# Patient Record
Sex: Female | Born: 1980 | Race: Black or African American | Hispanic: No | Marital: Single | Smoking: Never smoker
Health system: Southern US, Community
[De-identification: ages and names within clinical notes are randomized; demographics above are authoritative.]

## PROBLEM LIST (undated history)

## (undated) DIAGNOSIS — E079 Disorder of thyroid, unspecified: Secondary | ICD-10-CM

---

## 1999-01-02 ENCOUNTER — Emergency Department (HOSPITAL_COMMUNITY): Admission: EM | Admit: 1999-01-02 | Discharge: 1999-01-02 | Payer: Self-pay | Admitting: Emergency Medicine

## 1999-09-25 ENCOUNTER — Emergency Department (HOSPITAL_COMMUNITY): Admission: EM | Admit: 1999-09-25 | Discharge: 1999-09-25 | Payer: Self-pay

## 2000-11-26 ENCOUNTER — Encounter: Payer: Self-pay | Admitting: Obstetrics

## 2000-11-26 ENCOUNTER — Inpatient Hospital Stay (HOSPITAL_COMMUNITY): Admission: AD | Admit: 2000-11-26 | Discharge: 2000-11-26 | Payer: Self-pay | Admitting: Obstetrics

## 2000-12-09 ENCOUNTER — Encounter: Admission: RE | Admit: 2000-12-09 | Discharge: 2000-12-09 | Payer: Self-pay | Admitting: Family Medicine

## 2001-04-09 ENCOUNTER — Emergency Department (HOSPITAL_COMMUNITY): Admission: EM | Admit: 2001-04-09 | Discharge: 2001-04-10 | Payer: Self-pay | Admitting: Emergency Medicine

## 2001-04-27 ENCOUNTER — Inpatient Hospital Stay (HOSPITAL_COMMUNITY): Admission: AD | Admit: 2001-04-27 | Discharge: 2001-04-27 | Payer: Self-pay | Admitting: *Deleted

## 2001-09-15 ENCOUNTER — Encounter: Admission: RE | Admit: 2001-09-15 | Discharge: 2001-09-15 | Payer: Self-pay | Admitting: Family Medicine

## 2001-09-28 ENCOUNTER — Emergency Department (HOSPITAL_COMMUNITY): Admission: EM | Admit: 2001-09-28 | Discharge: 2001-09-28 | Payer: Self-pay | Admitting: Emergency Medicine

## 2001-11-03 ENCOUNTER — Encounter: Admission: RE | Admit: 2001-11-03 | Discharge: 2001-11-03 | Payer: Self-pay | Admitting: Family Medicine

## 2001-12-09 ENCOUNTER — Encounter: Admission: RE | Admit: 2001-12-09 | Discharge: 2001-12-09 | Payer: Self-pay | Admitting: Family Medicine

## 2002-06-11 ENCOUNTER — Encounter: Admission: RE | Admit: 2002-06-11 | Discharge: 2002-06-11 | Payer: Self-pay | Admitting: Family Medicine

## 2002-08-31 ENCOUNTER — Encounter: Admission: RE | Admit: 2002-08-31 | Discharge: 2002-08-31 | Payer: Self-pay | Admitting: Sports Medicine

## 2002-09-10 ENCOUNTER — Other Ambulatory Visit: Admission: RE | Admit: 2002-09-10 | Discharge: 2002-09-10 | Payer: Self-pay | Admitting: Family Medicine

## 2003-01-18 ENCOUNTER — Encounter: Payer: Self-pay | Admitting: Emergency Medicine

## 2003-01-18 ENCOUNTER — Emergency Department (HOSPITAL_COMMUNITY): Admission: EM | Admit: 2003-01-18 | Discharge: 2003-01-19 | Payer: Self-pay | Admitting: Emergency Medicine

## 2003-02-22 ENCOUNTER — Encounter: Admission: RE | Admit: 2003-02-22 | Discharge: 2003-02-22 | Payer: Self-pay | Admitting: Family Medicine

## 2003-04-21 ENCOUNTER — Encounter: Admission: RE | Admit: 2003-04-21 | Discharge: 2003-04-21 | Payer: Self-pay | Admitting: Sports Medicine

## 2003-07-18 ENCOUNTER — Encounter: Admission: RE | Admit: 2003-07-18 | Discharge: 2003-07-18 | Payer: Self-pay | Admitting: Family Medicine

## 2003-10-18 ENCOUNTER — Encounter: Admission: RE | Admit: 2003-10-18 | Discharge: 2003-10-18 | Payer: Self-pay | Admitting: Family Medicine

## 2003-11-04 ENCOUNTER — Encounter: Admission: RE | Admit: 2003-11-04 | Discharge: 2003-11-04 | Payer: Self-pay | Admitting: Sports Medicine

## 2003-11-04 ENCOUNTER — Other Ambulatory Visit: Admission: RE | Admit: 2003-11-04 | Discharge: 2003-11-04 | Payer: Self-pay | Admitting: Family Medicine

## 2004-01-05 ENCOUNTER — Encounter: Admission: RE | Admit: 2004-01-05 | Discharge: 2004-01-05 | Payer: Self-pay | Admitting: Sports Medicine

## 2004-01-12 ENCOUNTER — Encounter: Admission: RE | Admit: 2004-01-12 | Discharge: 2004-01-12 | Payer: Self-pay | Admitting: Family Medicine

## 2004-02-06 ENCOUNTER — Emergency Department (HOSPITAL_COMMUNITY): Admission: EM | Admit: 2004-02-06 | Discharge: 2004-02-07 | Payer: Self-pay | Admitting: Emergency Medicine

## 2004-03-20 ENCOUNTER — Encounter: Admission: RE | Admit: 2004-03-20 | Discharge: 2004-03-20 | Payer: Self-pay | Admitting: Sports Medicine

## 2004-05-29 ENCOUNTER — Encounter: Admission: RE | Admit: 2004-05-29 | Discharge: 2004-05-29 | Payer: Self-pay | Admitting: Family Medicine

## 2004-08-29 ENCOUNTER — Ambulatory Visit: Payer: Self-pay | Admitting: Family Medicine

## 2004-09-20 ENCOUNTER — Ambulatory Visit: Payer: Self-pay | Admitting: Family Medicine

## 2004-11-06 ENCOUNTER — Ambulatory Visit: Payer: Self-pay | Admitting: Family Medicine

## 2004-11-09 ENCOUNTER — Ambulatory Visit: Payer: Self-pay | Admitting: Sports Medicine

## 2004-11-18 ENCOUNTER — Encounter (INDEPENDENT_AMBULATORY_CARE_PROVIDER_SITE_OTHER): Payer: Self-pay | Admitting: *Deleted

## 2004-11-18 LAB — CONVERTED CEMR LAB

## 2004-11-26 ENCOUNTER — Ambulatory Visit: Payer: Self-pay | Admitting: Family Medicine

## 2004-11-28 ENCOUNTER — Ambulatory Visit: Payer: Self-pay | Admitting: Family Medicine

## 2004-12-27 ENCOUNTER — Ambulatory Visit: Payer: Self-pay | Admitting: Family Medicine

## 2005-04-24 ENCOUNTER — Ambulatory Visit: Payer: Self-pay | Admitting: Family Medicine

## 2005-05-13 ENCOUNTER — Ambulatory Visit: Payer: Self-pay | Admitting: Sports Medicine

## 2005-07-30 ENCOUNTER — Ambulatory Visit: Payer: Self-pay | Admitting: Family Medicine

## 2005-12-04 ENCOUNTER — Emergency Department (HOSPITAL_COMMUNITY): Admission: EM | Admit: 2005-12-04 | Discharge: 2005-12-04 | Payer: Self-pay | Admitting: Emergency Medicine

## 2005-12-12 ENCOUNTER — Ambulatory Visit: Payer: Self-pay | Admitting: Sports Medicine

## 2006-01-01 ENCOUNTER — Ambulatory Visit: Payer: Self-pay | Admitting: Family Medicine

## 2006-04-25 ENCOUNTER — Ambulatory Visit: Payer: Self-pay | Admitting: Family Medicine

## 2006-05-07 ENCOUNTER — Encounter (INDEPENDENT_AMBULATORY_CARE_PROVIDER_SITE_OTHER): Payer: Self-pay | Admitting: *Deleted

## 2006-05-07 ENCOUNTER — Ambulatory Visit: Payer: Self-pay | Admitting: Family Medicine

## 2006-05-13 ENCOUNTER — Encounter (HOSPITAL_COMMUNITY): Admission: RE | Admit: 2006-05-13 | Discharge: 2006-07-31 | Payer: Self-pay | Admitting: Family Medicine

## 2006-06-02 ENCOUNTER — Ambulatory Visit: Payer: Self-pay | Admitting: Family Medicine

## 2006-06-23 ENCOUNTER — Ambulatory Visit: Payer: Self-pay | Admitting: Sports Medicine

## 2006-07-12 ENCOUNTER — Emergency Department (HOSPITAL_COMMUNITY): Admission: EM | Admit: 2006-07-12 | Discharge: 2006-07-12 | Payer: Self-pay | Admitting: Family Medicine

## 2006-08-26 ENCOUNTER — Ambulatory Visit: Payer: Self-pay | Admitting: Family Medicine

## 2006-09-10 ENCOUNTER — Ambulatory Visit: Payer: Self-pay | Admitting: Sports Medicine

## 2006-09-11 ENCOUNTER — Ambulatory Visit (HOSPITAL_COMMUNITY): Admission: RE | Admit: 2006-09-11 | Discharge: 2006-09-11 | Payer: Self-pay | Admitting: Family Medicine

## 2006-12-29 ENCOUNTER — Emergency Department (HOSPITAL_COMMUNITY): Admission: EM | Admit: 2006-12-29 | Discharge: 2006-12-29 | Payer: Self-pay | Admitting: Family Medicine

## 2007-01-15 DIAGNOSIS — F329 Major depressive disorder, single episode, unspecified: Secondary | ICD-10-CM

## 2007-01-15 DIAGNOSIS — G43909 Migraine, unspecified, not intractable, without status migrainosus: Secondary | ICD-10-CM | POA: Insufficient documentation

## 2007-01-15 DIAGNOSIS — J309 Allergic rhinitis, unspecified: Secondary | ICD-10-CM | POA: Insufficient documentation

## 2007-01-15 DIAGNOSIS — M545 Low back pain: Secondary | ICD-10-CM

## 2007-01-15 DIAGNOSIS — E0590 Thyrotoxicosis, unspecified without thyrotoxic crisis or storm: Secondary | ICD-10-CM | POA: Insufficient documentation

## 2007-01-15 DIAGNOSIS — E0500 Thyrotoxicosis with diffuse goiter without thyrotoxic crisis or storm: Secondary | ICD-10-CM | POA: Insufficient documentation

## 2007-01-16 ENCOUNTER — Encounter (INDEPENDENT_AMBULATORY_CARE_PROVIDER_SITE_OTHER): Payer: Self-pay | Admitting: *Deleted

## 2007-03-23 ENCOUNTER — Emergency Department (HOSPITAL_COMMUNITY): Admission: EM | Admit: 2007-03-23 | Discharge: 2007-03-23 | Payer: Self-pay | Admitting: Emergency Medicine

## 2007-11-16 ENCOUNTER — Emergency Department (HOSPITAL_COMMUNITY): Admission: EM | Admit: 2007-11-16 | Discharge: 2007-11-16 | Payer: Self-pay | Admitting: Emergency Medicine

## 2007-12-29 ENCOUNTER — Ambulatory Visit: Payer: Self-pay | Admitting: Family Medicine

## 2007-12-29 ENCOUNTER — Encounter (INDEPENDENT_AMBULATORY_CARE_PROVIDER_SITE_OTHER): Payer: Self-pay | Admitting: *Deleted

## 2007-12-29 LAB — CONVERTED CEMR LAB: Rapid Strep: NEGATIVE

## 2008-02-21 ENCOUNTER — Emergency Department (HOSPITAL_COMMUNITY): Admission: EM | Admit: 2008-02-21 | Discharge: 2008-02-21 | Payer: Self-pay | Admitting: Family Medicine

## 2008-03-30 ENCOUNTER — Emergency Department (HOSPITAL_COMMUNITY): Admission: EM | Admit: 2008-03-30 | Discharge: 2008-03-30 | Payer: Self-pay | Admitting: Family Medicine

## 2008-08-10 ENCOUNTER — Emergency Department (HOSPITAL_COMMUNITY): Admission: EM | Admit: 2008-08-10 | Discharge: 2008-08-10 | Payer: Self-pay | Admitting: Emergency Medicine

## 2008-09-08 ENCOUNTER — Ambulatory Visit: Payer: Self-pay | Admitting: Endocrinology

## 2008-09-08 ENCOUNTER — Encounter (INDEPENDENT_AMBULATORY_CARE_PROVIDER_SITE_OTHER): Payer: Self-pay | Admitting: *Deleted

## 2008-09-08 LAB — CONVERTED CEMR LAB
Free T4: 1 ng/dL (ref 0.6–1.6)
TSH: 0.69 microintl units/mL (ref 0.35–5.50)

## 2008-10-04 ENCOUNTER — Telehealth: Payer: Self-pay | Admitting: *Deleted

## 2008-11-28 ENCOUNTER — Telehealth: Payer: Self-pay | Admitting: *Deleted

## 2008-12-26 ENCOUNTER — Emergency Department (HOSPITAL_COMMUNITY): Admission: EM | Admit: 2008-12-26 | Discharge: 2008-12-26 | Payer: Self-pay | Admitting: Family Medicine

## 2009-01-13 ENCOUNTER — Ambulatory Visit: Payer: Self-pay | Admitting: Family Medicine

## 2009-01-13 ENCOUNTER — Encounter (INDEPENDENT_AMBULATORY_CARE_PROVIDER_SITE_OTHER): Payer: Self-pay | Admitting: Family Medicine

## 2009-01-25 ENCOUNTER — Telehealth (INDEPENDENT_AMBULATORY_CARE_PROVIDER_SITE_OTHER): Payer: Self-pay | Admitting: *Deleted

## 2009-02-09 ENCOUNTER — Encounter: Payer: Self-pay | Admitting: Family Medicine

## 2009-02-09 ENCOUNTER — Ambulatory Visit: Payer: Self-pay | Admitting: Family Medicine

## 2009-02-13 ENCOUNTER — Telehealth (INDEPENDENT_AMBULATORY_CARE_PROVIDER_SITE_OTHER): Payer: Self-pay | Admitting: Family Medicine

## 2009-02-13 ENCOUNTER — Ambulatory Visit: Payer: Self-pay | Admitting: Family Medicine

## 2009-02-13 ENCOUNTER — Encounter: Payer: Self-pay | Admitting: Family Medicine

## 2009-02-13 DIAGNOSIS — J111 Influenza due to unidentified influenza virus with other respiratory manifestations: Secondary | ICD-10-CM | POA: Insufficient documentation

## 2009-07-25 ENCOUNTER — Emergency Department (HOSPITAL_BASED_OUTPATIENT_CLINIC_OR_DEPARTMENT_OTHER): Admission: EM | Admit: 2009-07-25 | Discharge: 2009-07-25 | Payer: Self-pay | Admitting: Emergency Medicine

## 2009-11-06 ENCOUNTER — Emergency Department (HOSPITAL_BASED_OUTPATIENT_CLINIC_OR_DEPARTMENT_OTHER): Admission: EM | Admit: 2009-11-06 | Discharge: 2009-11-06 | Payer: Self-pay | Admitting: Emergency Medicine

## 2009-11-06 ENCOUNTER — Ambulatory Visit: Payer: Self-pay | Admitting: Diagnostic Radiology

## 2010-02-21 ENCOUNTER — Emergency Department (HOSPITAL_BASED_OUTPATIENT_CLINIC_OR_DEPARTMENT_OTHER): Admission: EM | Admit: 2010-02-21 | Discharge: 2010-02-21 | Payer: Self-pay | Admitting: Emergency Medicine

## 2010-09-25 ENCOUNTER — Encounter: Payer: Self-pay | Admitting: Family Medicine

## 2010-12-18 NOTE — Miscellaneous (Signed)
Summary: Nasal Spray  Clinical Lists Changes  Medications: Added new medication of FLONASE 50 MCG/ACT SUSP (FLUTICASONE PROPIONATE) 1 spray each nostril two times a day as needed congestion - Signed Rx of FLONASE 50 MCG/ACT SUSP (FLUTICASONE PROPIONATE) 1 spray each nostril two times a day as needed congestion;  #1 x 0;  Signed;  Entered by: Milinda Antis MD;  Authorized by: Milinda Antis MD;  Method used: Electronically to [facility] [phone] [place], [place], [place]  [address], Ph: [phone], Fax: [phone] Pt here with her mother having nasal congestion using OTC meds Saline/ allergy medication wanted nasal spray to try. Will give Flonase, pt can f/u in clinic if symptoms persist    Prescriptions: FLONASE 50 MCG/ACT SUSP (FLUTICASONE PROPIONATE) 1 spray each nostril two times a day as needed congestion  #1 x 0   Entered and Authorized by:   Milinda Antis MD   Signed by:   Milinda Antis MD on 09/25/2010   Method used:   Electronically to        [facility] [address] (retail)       [phone]       [place], [place]  [address]       Ph: [phone]       Fax: [phone]   RxID:   [id number]

## 2010-12-19 ENCOUNTER — Encounter: Payer: Self-pay | Admitting: *Deleted

## 2011-02-18 ENCOUNTER — Emergency Department (HOSPITAL_BASED_OUTPATIENT_CLINIC_OR_DEPARTMENT_OTHER)
Admission: EM | Admit: 2011-02-18 | Discharge: 2011-02-18 | Disposition: A | Discharge status: Home / Self Care | Payer: Medicaid Other | Attending: Emergency Medicine | Admitting: Emergency Medicine

## 2011-02-18 DIAGNOSIS — J069 Acute upper respiratory infection, unspecified: Secondary | ICD-10-CM | POA: Insufficient documentation

## 2011-02-18 LAB — RAPID STREP SCREEN (MED CTR MEBANE ONLY): Streptococcus, Group A Screen (Direct): NEGATIVE

## 2011-02-22 LAB — URINALYSIS, ROUTINE W REFLEX MICROSCOPIC
Bilirubin Urine: NEGATIVE
Hgb urine dipstick: NEGATIVE
Ketones, ur: NEGATIVE mg/dL
Protein, ur: NEGATIVE mg/dL
Urobilinogen, UA: 0.2 mg/dL (ref 0.0–1.0)

## 2011-05-13 ENCOUNTER — Emergency Department (INDEPENDENT_AMBULATORY_CARE_PROVIDER_SITE_OTHER): Payer: Self-pay

## 2011-05-13 ENCOUNTER — Emergency Department (HOSPITAL_BASED_OUTPATIENT_CLINIC_OR_DEPARTMENT_OTHER)
Admission: EM | Admit: 2011-05-13 | Discharge: 2011-05-13 | Disposition: A | Discharge status: Home / Self Care | Payer: Self-pay | Attending: Emergency Medicine | Admitting: Emergency Medicine

## 2011-05-13 DIAGNOSIS — J329 Chronic sinusitis, unspecified: Secondary | ICD-10-CM | POA: Insufficient documentation

## 2011-05-13 DIAGNOSIS — R51 Headache: Secondary | ICD-10-CM | POA: Insufficient documentation

## 2011-05-13 DIAGNOSIS — R110 Nausea: Secondary | ICD-10-CM

## 2011-05-13 DIAGNOSIS — J3489 Other specified disorders of nose and nasal sinuses: Secondary | ICD-10-CM

## 2011-08-13 LAB — POCT RAPID STREP A: Streptococcus, Group A Screen (Direct): NEGATIVE

## 2011-08-19 LAB — POCT URINALYSIS DIP (DEVICE)
Hgb urine dipstick: NEGATIVE
Ketones, ur: NEGATIVE
Specific Gravity, Urine: 1.025
pH: 6

## 2011-08-23 LAB — POCT URINALYSIS DIP (DEVICE)
Glucose, UA: NEGATIVE
Hgb urine dipstick: NEGATIVE
pH: 5.5

## 2011-08-23 LAB — POCT PREGNANCY, URINE

## 2011-08-23 LAB — WET PREP, GENITAL: Trich, Wet Prep: NONE SEEN

## 2011-08-23 LAB — GC/CHLAMYDIA PROBE AMP, GENITAL

## 2013-12-26 ENCOUNTER — Emergency Department (INDEPENDENT_AMBULATORY_CARE_PROVIDER_SITE_OTHER)
Admission: EM | Admit: 2013-12-26 | Discharge: 2013-12-26 | Disposition: A | Discharge status: Home / Self Care | Payer: Medicaid Other | Source: Home / Self Care | Attending: Family Medicine | Admitting: Family Medicine

## 2013-12-26 ENCOUNTER — Encounter: Payer: Self-pay | Admitting: Emergency Medicine

## 2013-12-26 ENCOUNTER — Emergency Department (INDEPENDENT_AMBULATORY_CARE_PROVIDER_SITE_OTHER): Payer: Medicaid Other

## 2013-12-26 DIAGNOSIS — E079 Disorder of thyroid, unspecified: Secondary | ICD-10-CM | POA: Insufficient documentation

## 2013-12-26 DIAGNOSIS — M549 Dorsalgia, unspecified: Secondary | ICD-10-CM

## 2013-12-26 DIAGNOSIS — S335XXA Sprain of ligaments of lumbar spine, initial encounter: Secondary | ICD-10-CM

## 2013-12-26 DIAGNOSIS — S239XXA Sprain of unspecified parts of thorax, initial encounter: Secondary | ICD-10-CM

## 2013-12-26 HISTORY — DX: Disorder of thyroid, unspecified: E07.9

## 2013-12-26 MED ORDER — CYCLOBENZAPRINE HCL 10 MG PO TABS: 10.0000 mg | ORAL_TABLET | Freq: Three times a day (TID) | ORAL | Status: DC

## 2013-12-26 MED ORDER — KETOROLAC TROMETHAMINE 60 MG/2ML IM SOLN
60.0000 mg | Freq: Once | INTRAMUSCULAR | Status: AC
  Administered 2013-12-26: 60 mg via INTRAMUSCULAR

## 2013-12-26 MED ORDER — MELOXICAM 15 MG PO TABS: 15.0000 mg | ORAL_TABLET | Freq: Every day | ORAL | Status: DC

## 2013-12-26 NOTE — Discharge Instructions (Signed)
Apply ice pack for 30 to 45 minutes every 1 to 4 hours.  Continue until pain decreases.  Begin back exercises in about 5 days when pain decreases.
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
## 2014-01-02 ENCOUNTER — Telehealth: Payer: Self-pay

## 2014-01-02 NOTE — ED Notes (Signed)
I called and spoke with patient and she is doing better. I advised to call back if anything changes or if she has questions or concerns.  

## 2014-01-13 ENCOUNTER — Encounter: Payer: Medicaid Other | Admitting: Sports Medicine

## 2014-01-14 ENCOUNTER — Ambulatory Visit (INDEPENDENT_AMBULATORY_CARE_PROVIDER_SITE_OTHER): Payer: Medicaid Other | Admitting: Sports Medicine

## 2014-01-14 DIAGNOSIS — S39012A Strain of muscle, fascia and tendon of lower back, initial encounter: Secondary | ICD-10-CM

## 2014-01-14 DIAGNOSIS — M545 Low back pain, unspecified: Secondary | ICD-10-CM | POA: Insufficient documentation

## 2014-01-14 DIAGNOSIS — S335XXA Sprain of ligaments of lumbar spine, initial encounter: Secondary | ICD-10-CM

## 2014-01-14 MED ORDER — PREDNISONE 50 MG PO TABS: ORAL_TABLET | ORAL | Status: DC

## 2014-01-14 MED ORDER — MELOXICAM 15 MG PO TABS: ORAL_TABLET | ORAL | Status: DC

## 2014-01-14 MED ORDER — CYCLOBENZAPRINE HCL 10 MG PO TABS: ORAL_TABLET | ORAL | Status: DC
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
## 2014-01-28 ENCOUNTER — Ambulatory Visit (INDEPENDENT_AMBULATORY_CARE_PROVIDER_SITE_OTHER): Payer: Medicaid Other | Admitting: Sports Medicine

## 2014-01-28 ENCOUNTER — Encounter: Payer: Self-pay | Admitting: Sports Medicine

## 2014-01-28 VITALS — BP 120/79 | HR 109 | Ht 61.0 in | Wt 150.0 lb

## 2014-01-28 DIAGNOSIS — S335XXA Sprain of ligaments of lumbar spine, initial encounter: Secondary | ICD-10-CM

## 2014-01-28 DIAGNOSIS — S39012A Strain of muscle, fascia and tendon of lower back, initial encounter: Secondary | ICD-10-CM

## 2014-01-28 MED ORDER — DICLOFENAC SODIUM 75 MG PO TBEC: 75.0000 mg | DELAYED_RELEASE_TABLET | Freq: Two times a day (BID) | ORAL | Status: DC

## 2014-01-28 NOTE — Assessment & Plan Note (Signed)
Pain is persistent, no better than before per patient. At this point we are going to obtain an MRI, her x-rays were completely negative. Switching to Voltaren twice a day. Return to see me to go over MRI results.
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
## 2014-02-07 ENCOUNTER — Telehealth: Payer: Self-pay | Admitting: *Deleted

## 2014-02-07 NOTE — Telephone Encounter (Signed)
PA for MRI Lumbar w/o has been denied.  Meyer CoryMisty Ahmad, LPN

## 2014-02-11 ENCOUNTER — Telehealth: Payer: Self-pay

## 2014-02-11 NOTE — Telephone Encounter (Signed)
Ariana Moreno called about when her MRI will be scheduled. I see where the PA for the MRI was not approved. She states this should be taken care of by the [employer] insurance through her lawyer. I advised her to call her lawyer first to find out if an MRI will be allowed.

## 2014-02-14 ENCOUNTER — Ambulatory Visit: Payer: Medicaid Other | Admitting: Sports Medicine

## 2014-02-14 ENCOUNTER — Telehealth: Payer: Self-pay | Admitting: *Deleted

## 2014-02-14 DIAGNOSIS — Z0289 Encounter for other administrative examinations: Secondary | ICD-10-CM

## 2014-02-14 NOTE — Telephone Encounter (Signed)
Prior auth obtained for MRI Lumbar w/o contrast per Dr. Awanda Minkhekkekandam auth # [id number].  Imaging notified. Barry DienesKimberly Meyah Corle, LPN

## 2014-02-15 ENCOUNTER — Telehealth: Payer: Self-pay | Admitting: *Deleted

## 2014-02-15 NOTE — Telephone Encounter (Signed)
error 

## 2014-02-19 ENCOUNTER — Ambulatory Visit (HOSPITAL_BASED_OUTPATIENT_CLINIC_OR_DEPARTMENT_OTHER): Payer: Medicaid Other

## 2014-02-21 ENCOUNTER — Telehealth: Payer: Self-pay | Admitting: Sports Medicine

## 2014-02-21 NOTE — Telephone Encounter (Signed)
Referral dated 4/4 for this patient Need to be more specific on "Procedure"section of referral

## 2014-02-21 NOTE — Telephone Encounter (Signed)
Why is this sent to me!?  A PA has already been done as well as a peer to peer and it is approved, the "referral" is for an MRI of the lumbar spine and the requisition is easily accessible in the "imaging" tab.Marland Kitchen..Marland Kitchen

## 2014-02-21 NOTE — Telephone Encounter (Signed)
Please see note below. Aradhya Shellenbarger,CMA  

## 2014-12-09 ENCOUNTER — Ambulatory Visit: Payer: Medicaid Other | Admitting: Sports Medicine

## 2014-12-14 ENCOUNTER — Ambulatory Visit: Payer: Medicaid Other | Admitting: Sports Medicine

## 2014-12-15 ENCOUNTER — Ambulatory Visit: Payer: Medicaid Other | Admitting: Sports Medicine

## 2014-12-19 ENCOUNTER — Ambulatory Visit (INDEPENDENT_AMBULATORY_CARE_PROVIDER_SITE_OTHER): Payer: Medicaid Other

## 2014-12-19 ENCOUNTER — Encounter: Payer: Self-pay | Admitting: Sports Medicine

## 2014-12-19 ENCOUNTER — Ambulatory Visit (INDEPENDENT_AMBULATORY_CARE_PROVIDER_SITE_OTHER): Payer: Medicaid Other | Admitting: Sports Medicine

## 2014-12-19 VITALS — BP 144/84 | HR 82 | Ht 60.0 in | Wt 156.0 lb

## 2014-12-19 DIAGNOSIS — M545 Low back pain, unspecified: Secondary | ICD-10-CM

## 2014-12-19 DIAGNOSIS — M5136 Other intervertebral disc degeneration, lumbar region: Secondary | ICD-10-CM

## 2014-12-19 DIAGNOSIS — M7918 Myalgia, other site: Secondary | ICD-10-CM | POA: Insufficient documentation

## 2014-12-19 DIAGNOSIS — M791 Myalgia: Secondary | ICD-10-CM

## 2014-12-19 MED ORDER — MELOXICAM 15 MG PO TABS: ORAL_TABLET | ORAL | Status: AC

## 2014-12-19 MED ORDER — DULOXETINE HCL 30 MG PO CPEP: 30.0000 mg | ORAL_CAPSULE | Freq: Every day | ORAL | Status: AC

## 2014-12-19 NOTE — Assessment & Plan Note (Addendum)
Widespread in the neck and back, she does not quite meet criteria for fibromyalgia. We are going to obtain a lumbar spine MRI but I do think she will eventually need a serotonin and norepinephrine reuptake inhibitor. She does also endorse depression. I'm going to add a prescription for Cymbalta.
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
## 2014-12-26 ENCOUNTER — Ambulatory Visit: Payer: Medicaid Other | Attending: Sports Medicine | Admitting: Physical Therapy

## 2014-12-27 ENCOUNTER — Ambulatory Visit (INDEPENDENT_AMBULATORY_CARE_PROVIDER_SITE_OTHER): Payer: Medicaid Other | Admitting: Sports Medicine

## 2014-12-27 ENCOUNTER — Encounter: Payer: Self-pay | Admitting: Sports Medicine

## 2014-12-27 VITALS — BP 124/91 | HR 123 | Ht 62.0 in | Wt 155.0 lb

## 2014-12-27 DIAGNOSIS — M545 Low back pain: Secondary | ICD-10-CM

## 2014-12-27 DIAGNOSIS — M7918 Myalgia, other site: Secondary | ICD-10-CM

## 2014-12-27 DIAGNOSIS — M791 Myalgia: Secondary | ICD-10-CM

## 2014-12-27 NOTE — Assessment & Plan Note (Signed)
Overall feeling significantly better since starting Cymbalta, MRI was essentially negative with the exception of mild L4-5 and L5-S1 facet hypertrophy.  discs were pristine, this lends to the fact that her symptoms are likely related to myofascial pain rather than lumbar spondylosis.

## 2014-12-27 NOTE — Assessment & Plan Note (Signed)
Overall feeling significantly better since starting Cymbalta, MRI was essentially negative with the exception of mild L4-5 and L5-S1 facet hypertrophy.  discs were pristine, this lends to the fact that her symptoms are likely related to myofascial pain rather than lumbar spondylosis. 

## 2014-12-27 NOTE — Progress Notes (Signed)
  Subjective:    CC: follow-up  HPI: Myofascial pain: Sadeel returns, she had back pain for some time, but also endorsed some symptoms of depression and anxiety, we'll obtain an MRI that was negative, and restarted Cymbalta, she tells me she is actually feeling significantly better, calmer, and with less pain in her back since starting the Cymbalta.  Past medical history, Surgical history, Family history not pertinant except as noted below, Social history, Allergies, and medications have been entered into the medical record, reviewed, and no changes needed.   Review of Systems: No fevers, chills, night sweats, weight loss, chest pain, or shortness of breath.   Objective:    General: Well Developed, well nourished, and in no acute distress.  Neuro: Alert and oriented x3, extra-ocular muscles intact, sensation grossly intact.  HEENT: Normocephalic, atraumatic, pupils equal round reactive to light, neck supple, no masses, no lymphadenopathy, thyroid nonpalpable.  Skin: Warm and dry, no rashes. Cardiac: Regular rate and rhythm, no murmurs rubs or gallops, no lower extremity edema.  Respiratory: Clear to auscultation bilaterally. Not using accessory muscles, speaking in full sentences. Back Exam:  Inspection: Unremarkable  Motion: Flexion 45 deg, Extension 45 deg, Side Bending to 45 deg bilaterally,  Rotation to 45 deg bilaterally  SLR laying: Negative  XSLR laying: Negative  Palpable tenderness: None. FABER: negative. Sensory change: Gross sensation intact to all lumbar and sacral dermatomes.  Reflexes: 2+ at both patellar tendons, 2+ at achilles tendons, Babinski's downgoing.  Strength at foot  Plantar-flexion: 5/5 Dorsi-flexion: 5/5 Eversion: 5/5 Inversion: 5/5  Leg strength  Quad: 5/5 Hamstring: 5/5 Hip flexor: 5/5 Hip abductors: 5/5  Gait unremarkable.  MRI reviewed, negative with the exception of slight L4-5 and L5-S1 facet hypertrophy  Impression and Recommendations:

## 2015-01-24 ENCOUNTER — Ambulatory Visit: Payer: Medicaid Other | Attending: Sports Medicine | Admitting: Physical Therapy

## 2015-02-24 ENCOUNTER — Ambulatory Visit: Payer: Medicaid Other | Admitting: Sports Medicine
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
## 2023-06-23 ENCOUNTER — Other Ambulatory Visit: Payer: Self-pay

## 2023-06-23 ENCOUNTER — Ambulatory Visit
Admission: EM | Admit: 2023-06-23 | Discharge: 2023-06-23 | Disposition: A | Discharge status: Home / Self Care | Payer: 59 | Attending: Family Medicine | Admitting: Family Medicine

## 2023-06-23 DIAGNOSIS — J309 Allergic rhinitis, unspecified: Secondary | ICD-10-CM | POA: Diagnosis not present

## 2023-06-23 DIAGNOSIS — J0100 Acute maxillary sinusitis, unspecified: Secondary | ICD-10-CM | POA: Diagnosis not present

## 2023-06-23 DIAGNOSIS — R059 Cough, unspecified: Secondary | ICD-10-CM

## 2023-06-23 DIAGNOSIS — R0981 Nasal congestion: Secondary | ICD-10-CM

## 2023-06-23 LAB — POC SARS CORONAVIRUS 2 AG -  ED: SARS Coronavirus 2 Ag: NEGATIVE

## 2023-06-23 MED ORDER — BENZONATATE 200 MG PO CAPS: 200.0000 mg | ORAL_CAPSULE | Freq: Three times a day (TID) | ORAL | 0 refills | Status: AC | PRN

## 2023-06-23 MED ORDER — AZITHROMYCIN 250 MG PO TABS: 250.0000 mg | ORAL_TABLET | Freq: Every day | ORAL | 0 refills | Status: AC

## 2023-06-23 MED ORDER — PREDNISONE 20 MG PO TABS: ORAL_TABLET | ORAL | 0 refills | Status: AC

## 2023-06-23 MED ORDER — FEXOFENADINE HCL 180 MG PO TABS: 180.0000 mg | ORAL_TABLET | Freq: Every day | ORAL | 0 refills | Status: AC

## 2023-06-23 NOTE — Discharge Instructions (Addendum)
Advised patient to take medications as directed with food to completion.  Advised patient to take prednisone and Allegra with Zithromax daily for the next 5 days.  Advised may take Allegra as needed afterwards for concurrent postnasal drainage/drip.  Advised may use Tessalon Perles daily or as needed for cough.  Encouraged increase daily water intake to 64 ounces per day while taking these medications.  Advised if symptoms worsen and/or unresolved please follow-up with PCP or here for further evaluation.

## 2023-06-23 NOTE — ED Provider Notes (Signed)
[facility]    CSN: [id number] Arrival date & time: 06/23/23  0865      History   Chief Complaint No chief complaint on file.   HPI Ariana Moreno is a 42 y.o. female.   HPI Very pleasant 42 year old female presents with sore throat, and bodyaches for 3 days.  Patient reports possible COVID-19 exposure.  PMH significant for myofascial pain syndrome, influenza-like illness, and migraine.  Past Medical History:  Diagnosis Date   Thyroid disease     Patient Active Problem List   Diagnosis Date Noted   Myofascial pain syndrome 12/19/2014   Low back pain 01/14/2014   Thyroid disease    INFLUENZA LIKE ILLNESS 02/13/2009   GRAVES DISEASE 01/15/2007   HYPERTHYROIDISM, NOS 01/15/2007   DEPRESSIVE DISORDER, NOS 01/15/2007   MIGRAINE, UNSPEC., W/O INTRACTABLE MIGRAINE 01/15/2007   RHINITIS, ALLERGIC 01/15/2007    No past surgical history on file.  OB History   No obstetric history on file.      Home Medications    Prior to Admission medications   Medication Sig Start Date End Date Taking? Authorizing Provider  azithromycin (ZITHROMAX) 250 MG tablet Take 1 tablet (250 mg total) by mouth daily. Take first 2 tablets together, then 1 every day until finished. 06/23/23  Yes Trevor Iha, FNP  benzonatate (TESSALON) 200 MG capsule Take 1 capsule (200 mg total) by mouth 3 (three) times daily as needed for up to 7 days. 06/23/23 06/30/23 Yes Trevor Iha, FNP  fexofenadine [facility] ALLERGY) 180 MG tablet Take 1 tablet (180 mg total) by mouth daily for 15 days. 06/23/23 07/08/23 Yes Trevor Iha, FNP  predniSONE (DELTASONE) 20 MG tablet Take 3 tabs PO daily x 5 days. 06/23/23  Yes Trevor Iha, FNP  cetirizine (ZYRTEC) 10 MG tablet Take 10 mg by mouth daily.    [provider]  DULoxetine (CYMBALTA) 30 MG capsule Take 1 capsule (30 mg total) by mouth daily. 12/19/14   Monica Becton, MD  meloxicam (MOBIC) 15 MG tablet One tab PO qAM with breakfast for 2  weeks, then daily prn pain. 12/19/14   Monica Becton, MD    Family History No family history on file.  Social History Social History   Tobacco Use   Smoking status: Never   Smokeless tobacco: Never  Substance Use Topics   Alcohol use: No   Drug use: No     Allergies   Amoxicillin and Doxycycline   Review of Systems Review of Systems  HENT:  Positive for sore throat.   Respiratory:  Positive for cough.   Musculoskeletal:  Positive for arthralgias and myalgias.  All other systems reviewed and are negative.    Physical Exam Triage Vital Signs ED Triage Vitals  Encounter Vitals Group     BP 06/23/23 1902 (!) 152/99     Systolic BP Percentile --      Diastolic BP Percentile --      Pulse Rate 06/23/23 1902 98     Resp 06/23/23 1902 16     Temp 06/23/23 1902 97.8 F (36.6 C)     Temp Source 06/23/23 1902 Oral     SpO2 06/23/23 1902 100 %     Weight --      Height --      Head Circumference --      Peak Flow --      Pain Score 06/23/23 1903 7     Pain Loc --  Pain Education --      Exclude from Growth Chart --    No data found.  Updated Vital Signs BP (!) 152/99 (BP Location: Right Arm)   Pulse 98   Temp 97.8 F (36.6 C) (Oral)   Resp 16   SpO2 100%      Physical Exam Vitals and nursing note reviewed.  Constitutional:      Appearance: Normal appearance. She is obese. She is ill-appearing.  HENT:     Head: Normocephalic and atraumatic.     Right Ear: Tympanic membrane and external ear normal.     Left Ear: Tympanic membrane and external ear normal.     Ears:     Comments: Moderate eustachian tube dysfunction noted bilaterally    Nose:     Comments: Turbinates are erythematous/edematous    Mouth/Throat:     Mouth: Mucous membranes are moist.     Pharynx: Oropharynx is clear.     Comments: Significant amount of clear drainage of posterior oropharynx noted Eyes:     Extraocular Movements: Extraocular movements intact.      Conjunctiva/sclera: Conjunctivae normal.     Pupils: Pupils are equal, round, and reactive to light.  Cardiovascular:     Rate and Rhythm: Normal rate and regular rhythm.     Pulses: Normal pulses.     Heart sounds: Normal heart sounds.  Pulmonary:     Effort: Pulmonary effort is normal.     Breath sounds: Normal breath sounds. No wheezing, rhonchi or rales.     Comments: Frequent nonproductive cough noted on exam Musculoskeletal:        General: Normal range of motion.     Cervical back: Normal range of motion and neck supple.  Skin:    General: Skin is warm and dry.  Neurological:     General: No focal deficit present.     Mental Status: She is alert and oriented to person, place, and time. Mental status is at baseline.  Psychiatric:        Mood and Affect: Mood normal.        Behavior: Behavior normal.      UC Treatments / Results  Labs (all labs ordered are listed, but only abnormal results are displayed) Labs Reviewed  POC SARS CORONAVIRUS 2 AG -  ED    EKG   Radiology No results found.  Procedures Procedures (including critical care time)  Medications Ordered in UC Medications - No data to display  Initial Impression / Assessment and Plan / UC Course  I have reviewed the triage vital signs and the nursing notes.  Pertinent labs & imaging results that were available during my care of the patient were reviewed by me and considered in my medical decision making (see chart for details).     MDM: 1.  Acute maxillary sinusitis, recurrence not specified-Rx Zithromax (500 mg day 1 then 250 mg daily x 4 days); 2.  Congestion of nasal sinus-Rx'd prednisone 60 mg daily x 5 days; 3.  Allergic rhinitis, unspecified seasonality, unspecified trigger-Rx'd Allegra 180 mg fexofenadine without the daily for the next 5 days, then as needed; 4.  Cough, unspecified type-Rx'd Tessalon Perles 200 mg 3 times daily, as needed. Advised patient to take medications as directed with food  to completion.  Advised patient to take prednisone and Allegra with Zithromax daily for the next 5 days.  Advised may take Allegra as needed afterwards for concurrent postnasal drainage/drip.  Advised may use Tessalon Perles daily  or as needed for cough.  Encouraged increase daily water intake to 64 ounces per day while taking these medications.  Advised if symptoms worsen and/or unresolved please follow-up with PCP or here for further evaluation.  Work note provided prior to discharge.  Patient discharged home, hemodynamically stable. Final Clinical Impressions(s) / UC Diagnoses   Final diagnoses:  Acute maxillary sinusitis, recurrence not specified  Congestion of nasal sinus  Allergic rhinitis, unspecified seasonality, unspecified trigger  Cough, unspecified type     Discharge Instructions      Advised patient to take medications as directed with food to completion.  Advised patient to take prednisone and Allegra with Zithromax daily for the next 5 days.  Advised may take Allegra as needed afterwards for concurrent postnasal drainage/drip.  Advised may use Tessalon Perles daily or as needed for cough.  Encouraged increase daily water intake to 64 ounces per day while taking these medications.  Advised if symptoms worsen and/or unresolved please follow-up with PCP or here for further evaluation.     ED Prescriptions     Medication Sig Dispense Auth. Provider   azithromycin (ZITHROMAX) 250 MG tablet Take 1 tablet (250 mg total) by mouth daily. Take first 2 tablets together, then 1 every day until finished. 6 tablet Trevor Iha, FNP   predniSONE (DELTASONE) 20 MG tablet Take 3 tabs PO daily x 5 days. 15 tablet Trevor Iha, FNP   fexofenadine [facility] ALLERGY) 180 MG tablet Take 1 tablet (180 mg total) by mouth daily for 15 days. 15 tablet Trevor Iha, FNP   benzonatate (TESSALON) 200 MG capsule Take 1 capsule (200 mg total) by mouth 3 (three) times daily as needed for up to 7 days.  40 capsule Trevor Iha, FNP      PDMP not reviewed this encounter.   Trevor Iha, FNP 06/23/23 1949

## 2023-06-23 NOTE — ED Triage Notes (Signed)
C/o sore throat, body aches since friday

## 2023-06-25 ENCOUNTER — Encounter: Payer: Self-pay | Admitting: Bariatrics

## 2023-07-01 ENCOUNTER — Telehealth (HOSPITAL_COMMUNITY): Payer: Self-pay | Admitting: Emergency Medicine

## 2023-07-01 NOTE — Telephone Encounter (Signed)
Received forwarded message from [facility] that patient as needing additional medications for cough.  However, message is from 5 days ago.  Attempted to call patient to clarify if she is still needing anything, or how she is doing.  Left voicemail
